# Patient Record
Sex: Female | Born: 1945 | Race: White | Hispanic: No | Smoking: Never smoker
Health system: Southern US, Community
[De-identification: ages and names within clinical notes are randomized; demographics above are authoritative.]

## PROBLEM LIST (undated history)

## (undated) DIAGNOSIS — F0390 Unspecified dementia without behavioral disturbance: Secondary | ICD-10-CM

## (undated) HISTORY — PX: ABDOMINAL HYSTERECTOMY: SHX81

---

## 2015-08-18 ENCOUNTER — Observation Stay (HOSPITAL_COMMUNITY)
Admission: EM | Admit: 2015-08-18 | Discharge: 2015-08-20 | Disposition: A | Discharge status: Home / Self Care | Payer: Medicare HMO | Attending: Surgery | Admitting: Surgery

## 2015-08-18 DIAGNOSIS — S060X9A Concussion with loss of consciousness of unspecified duration, initial encounter: Principal | ICD-10-CM | POA: Insufficient documentation

## 2015-08-18 DIAGNOSIS — S61412A Laceration without foreign body of left hand, initial encounter: Secondary | ICD-10-CM

## 2015-08-18 DIAGNOSIS — F0390 Unspecified dementia without behavioral disturbance: Secondary | ICD-10-CM | POA: Insufficient documentation

## 2015-08-18 DIAGNOSIS — S060XAA Concussion with loss of consciousness status unknown, initial encounter: Secondary | ICD-10-CM | POA: Diagnosis present

## 2015-08-18 DIAGNOSIS — S060X1A Concussion with loss of consciousness of 30 minutes or less, initial encounter: Secondary | ICD-10-CM

## 2015-08-18 DIAGNOSIS — N390 Urinary tract infection, site not specified: Secondary | ICD-10-CM | POA: Insufficient documentation

## 2015-08-18 DIAGNOSIS — R55 Syncope and collapse: Secondary | ICD-10-CM | POA: Diagnosis present

## 2015-08-18 DIAGNOSIS — Y92410 Unspecified street and highway as the place of occurrence of the external cause: Secondary | ICD-10-CM | POA: Diagnosis not present

## 2015-08-18 DIAGNOSIS — S0003XA Contusion of scalp, initial encounter: Secondary | ICD-10-CM | POA: Insufficient documentation

## 2015-08-18 HISTORY — DX: Unspecified dementia, unspecified severity, without behavioral disturbance, psychotic disturbance, mood disturbance, and anxiety: F03.90
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
## 2015-08-19 ENCOUNTER — Encounter (HOSPITAL_COMMUNITY): Payer: Self-pay | Admitting: Radiology

## 2015-08-19 ENCOUNTER — Emergency Department (HOSPITAL_COMMUNITY): Payer: Medicare HMO

## 2015-08-19 DIAGNOSIS — S060X9A Concussion with loss of consciousness of unspecified duration, initial encounter: Secondary | ICD-10-CM | POA: Diagnosis present

## 2015-08-19 DIAGNOSIS — S060XAA Concussion with loss of consciousness status unknown, initial encounter: Secondary | ICD-10-CM | POA: Diagnosis present

## 2015-08-19 LAB — COMPREHENSIVE METABOLIC PANEL
ALBUMIN: 3.9 g/dL (ref 3.5–5.0)
ALK PHOS: 44 U/L (ref 38–126)
ALT: 19 U/L (ref 14–54)
ANION GAP: 9 (ref 5–15)
AST: 28 U/L (ref 15–41)
BILIRUBIN TOTAL: 1 mg/dL (ref 0.3–1.2)
BUN: 7 mg/dL (ref 6–20)
CALCIUM: 9.4 mg/dL (ref 8.9–10.3)
CO2: 26 mmol/L (ref 22–32)
CREATININE: 0.96 mg/dL (ref 0.44–1.00)
Chloride: 101 mmol/L (ref 101–111)
GLUCOSE: 120 mg/dL — AB (ref 65–99)
Potassium: 3.5 mmol/L (ref 3.5–5.1)
Sodium: 136 mmol/L (ref 135–145)
TOTAL PROTEIN: 6.8 g/dL (ref 6.5–8.1)

## 2015-08-19 LAB — I-STAT CHEM 8, ED
BUN: 8 mg/dL (ref 6–20)
CALCIUM ION: 1.18 mmol/L (ref 1.12–1.23)
CHLORIDE: 100 mmol/L — AB (ref 101–111)
Creatinine, Ser: 1 mg/dL (ref 0.44–1.00)
Glucose, Bld: 119 mg/dL — ABNORMAL HIGH (ref 65–99)
HCT: 40 % (ref 36.0–46.0)
Hemoglobin: 13.6 g/dL (ref 12.0–15.0)
Potassium: 3.4 mmol/L — ABNORMAL LOW (ref 3.5–5.1)
SODIUM: 140 mmol/L (ref 135–145)
TCO2: 24 mmol/L (ref 0–100)

## 2015-08-19 LAB — URINALYSIS, ROUTINE W REFLEX MICROSCOPIC
BILIRUBIN URINE: NEGATIVE
GLUCOSE, UA: NEGATIVE mg/dL
HGB URINE DIPSTICK: NEGATIVE
KETONES UR: NEGATIVE mg/dL
Nitrite: NEGATIVE
PROTEIN: NEGATIVE mg/dL
Specific Gravity, Urine: 1.02 (ref 1.005–1.030)
pH: 5.5 (ref 5.0–8.0)

## 2015-08-19 LAB — URINE MICROSCOPIC-ADD ON

## 2015-08-19 LAB — CBC
HCT: 39.3 % (ref 36.0–46.0)
HEMOGLOBIN: 13 g/dL (ref 12.0–15.0)
MCH: 30.3 pg (ref 26.0–34.0)
MCHC: 33.1 g/dL (ref 30.0–36.0)
MCV: 91.6 fL (ref 78.0–100.0)
PLATELETS: 208 10*3/uL (ref 150–400)
RBC: 4.29 MIL/uL (ref 3.87–5.11)
RDW: 12.6 % (ref 11.5–15.5)
WBC: 7.7 10*3/uL (ref 4.0–10.5)

## 2015-08-19 LAB — I-STAT CG4 LACTIC ACID, ED: LACTIC ACID, VENOUS: 0.95 mmol/L (ref 0.5–1.9)

## 2015-08-19 LAB — SAMPLE TO BLOOD BANK

## 2015-08-19 LAB — ETHANOL

## 2015-08-19 LAB — CDS SEROLOGY

## 2015-08-19 LAB — PROTIME-INR
INR: 1.01
Prothrombin Time: 13.3 seconds (ref 11.4–15.2)

## 2015-08-19 MED ORDER — ONDANSETRON HCL 4 MG/2ML IJ SOLN: 4.0000 mg | Freq: Four times a day (QID) | INTRAMUSCULAR | Status: DC | PRN

## 2015-08-19 MED ORDER — SODIUM CHLORIDE 0.9% FLUSH: 3.0000 mL | INTRAVENOUS | Status: DC | PRN

## 2015-08-19 MED ORDER — OXYCODONE HCL 5 MG PO TABS
10.0000 mg | ORAL_TABLET | ORAL | Status: DC | PRN
  Administered 2015-08-19 (×2): 5 mg via ORAL
  Filled 2015-08-19 (×2): qty 2

## 2015-08-19 MED ORDER — SODIUM CHLORIDE 0.9 % IV SOLN: 250.0000 mL | INTRAVENOUS | Status: DC | PRN

## 2015-08-19 MED ORDER — CEFTRIAXONE SODIUM 1 G IJ SOLR
1.0000 g | INTRAMUSCULAR | Status: DC
  Filled 2015-08-19: qty 10

## 2015-08-19 MED ORDER — ONDANSETRON HCL 4 MG PO TABS: 4.0000 mg | ORAL_TABLET | Freq: Four times a day (QID) | ORAL | Status: DC | PRN

## 2015-08-19 MED ORDER — SODIUM CHLORIDE 0.9% FLUSH
3.0000 mL | Freq: Two times a day (BID) | INTRAVENOUS | Status: DC
  Administered 2015-08-19: 3 mL via INTRAVENOUS

## 2015-08-19 MED ORDER — CEFTRIAXONE SODIUM 1 G IJ SOLR
1.0000 g | Freq: Once | INTRAMUSCULAR | Status: AC
  Administered 2015-08-19: 1 g via INTRAVENOUS
  Filled 2015-08-19: qty 10

## 2015-08-19 MED ORDER — ENOXAPARIN SODIUM 40 MG/0.4ML ~~LOC~~ SOLN
40.0000 mg | SUBCUTANEOUS | Status: DC
  Administered 2015-08-19 – 2015-08-20 (×2): 40 mg via SUBCUTANEOUS
  Filled 2015-08-19 (×2): qty 0.4

## 2015-08-19 MED ORDER — IOPAMIDOL (ISOVUE-300) INJECTION 61%
INTRAVENOUS | Status: AC
  Administered 2015-08-19: 100 mL
  Filled 2015-08-19: qty 100

## 2015-08-19 MED ORDER — HYDROMORPHONE HCL 1 MG/ML IJ SOLN: 1.0000 mg | INTRAMUSCULAR | Status: DC | PRN

## 2015-08-19 MED ORDER — SODIUM CHLORIDE 0.9 % IV BOLUS (SEPSIS)
1000.0000 mL | Freq: Once | INTRAVENOUS | Status: AC
  Administered 2015-08-19: 1000 mL via INTRAVENOUS

## 2015-08-19 NOTE — ED Notes (Signed)
Attempted report 
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
## 2015-08-19 NOTE — ED Notes (Signed)
L hand laceration irrigated with saline. Covered with non adhesive dressing and wrapped with kling wrap.

## 2015-08-19 NOTE — Progress Notes (Signed)
   08/19/15 0000  Clinical Encounter Type  Visited With Patient  Visit Type ED;Trauma  Referral From Nurse  Spiritual Encounters  Spiritual Needs Emotional  Stress Factors  Patient Stress Factors Health changes  Chaplain checked in with patient before and after CT scan to see if she wanted family called. She insisted she did not. Will check again, as patient seems somewhat confused. Kirsty Monjaraz, Chaplain
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
## 2015-08-19 NOTE — ED Notes (Signed)
Backboard removed by MD Silverio LayYao.
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
## 2015-08-19 NOTE — ED Notes (Signed)
C Collar removed per Dr Silverio LayYao. Patient up to bedside commode at this time.

## 2015-08-19 NOTE — Progress Notes (Signed)
Pt is confused, gets out of bed and walks out the room w/o calling for assistance. Steady gait noted. Pt reoriented to the room set up. Frequent checks done.  Able to reach pt's daughter. As per daughter, pt has dementia and this confusion is her baseline.

## 2015-08-20 ENCOUNTER — Encounter (HOSPITAL_COMMUNITY): Payer: Self-pay | Admitting: General Practice

## 2015-08-20 DIAGNOSIS — S060X9A Concussion with loss of consciousness of unspecified duration, initial encounter: Secondary | ICD-10-CM | POA: Diagnosis not present

## 2015-08-20 DIAGNOSIS — F0390 Unspecified dementia without behavioral disturbance: Secondary | ICD-10-CM | POA: Diagnosis present

## 2015-08-20 DIAGNOSIS — N390 Urinary tract infection, site not specified: Secondary | ICD-10-CM | POA: Diagnosis present

## 2015-08-20 MED ORDER — TRAMADOL HCL 50 MG PO TABS: 50.0000 mg | ORAL_TABLET | Freq: Four times a day (QID) | ORAL | Status: DC | PRN

## 2015-08-20 MED ORDER — CIPROFLOXACIN HCL 250 MG PO TABS
250.0000 mg | ORAL_TABLET | Freq: Two times a day (BID) | ORAL | Status: DC
  Administered 2015-08-20: 250 mg via ORAL
  Filled 2015-08-20: qty 1
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
## 2015-08-20 NOTE — Progress Notes (Signed)
AVS given to Katelyn Snyder, her daughter.  Understanding verbalized after a long conversation about how to keep her mother safe at home.  IV removed by patient overnight. Belongings packed. Transportation with daughter.
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
## 2018-12-07 DEATH — deceased
